# Patient Record
Sex: Male | Born: 1995 | Race: White | Hispanic: Yes | Marital: Single | Smoking: Never smoker
Health system: Southern US, Community
[De-identification: ages and names within clinical notes are randomized; demographics above are authoritative.]

## PROBLEM LIST (undated history)

## (undated) DIAGNOSIS — Z9889 Other specified postprocedural states: Secondary | ICD-10-CM

## (undated) HISTORY — PX: OTHER SURGICAL HISTORY: SHX169

## (undated) HISTORY — PX: NASAL SEPTUM SURGERY: SHX37

## (undated) HISTORY — DX: Other specified postprocedural states: Z98.890

---
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
## 2022-10-27 ENCOUNTER — Ambulatory Visit: Payer: Self-pay | Admitting: Medical

## 2022-12-12 ENCOUNTER — Ambulatory Visit: Payer: Self-pay | Admitting: Medical

## 2022-12-13 ENCOUNTER — Ambulatory Visit (INDEPENDENT_AMBULATORY_CARE_PROVIDER_SITE_OTHER): Payer: PRIVATE HEALTH INSURANCE | Admitting: Medical

## 2022-12-13 ENCOUNTER — Encounter: Payer: Self-pay | Admitting: Medical

## 2022-12-13 VITALS — BP 137/72 | HR 78 | Temp 98.2°F | Resp 18 | Ht 78.0 in | Wt 245.0 lb

## 2022-12-13 DIAGNOSIS — Z0000 Encounter for general adult medical examination without abnormal findings: Secondary | ICD-10-CM

## 2022-12-13 LAB — COMPREHENSIVE METABOLIC PANEL
ALT: 50 U/L (ref 0–53)
AST: 27 U/L (ref 0–37)
Albumin: 4.6 g/dL (ref 3.5–5.2)
Alkaline Phosphatase: 86 U/L (ref 39–117)
BUN: 22 mg/dL (ref 6–23)
CO2: 29 mEq/L (ref 19–32)
Calcium: 9.4 mg/dL (ref 8.4–10.5)
Chloride: 102 mEq/L (ref 96–112)
Creatinine, Ser: 1.07 mg/dL (ref 0.40–1.50)
GFR: 96.18 mL/min (ref >60.00)
Glucose, Bld: 91 mg/dL (ref 70–99)
Potassium: 4.2 mEq/L (ref 3.5–5.1)
Sodium: 137 mEq/L (ref 135–145)
Total Bilirubin: 0.7 mg/dL (ref 0.2–1.2)
Total Protein: 7.3 g/dL (ref 6.0–8.3)

## 2022-12-13 LAB — CBC WITH DIFFERENTIAL/PLATELET
Basophils Absolute: 0.1 10*3/uL (ref 0.0–0.1)
Basophils Relative: 1 % (ref 0.0–3.0)
Eosinophils Absolute: 0.1 10*3/uL (ref 0.0–0.7)
Eosinophils Relative: 1.6 % (ref 0.0–5.0)
HCT: 45.5 % (ref 39.0–52.0)
Hemoglobin: 15.6 g/dL (ref 13.0–17.0)
Lymphocytes Relative: 29 % (ref 12.0–46.0)
Lymphs Abs: 1.6 10*3/uL (ref 0.7–4.0)
MCHC: 34.3 g/dL (ref 30.0–36.0)
MCV: 90.4 fl (ref 78.0–100.0)
Monocytes Absolute: 0.5 10*3/uL (ref 0.1–1.0)
Monocytes Relative: 9.2 % (ref 3.0–12.0)
Neutro Abs: 3.3 10*3/uL (ref 1.4–7.7)
Neutrophils Relative %: 59.2 % (ref 43.0–77.0)
Platelets: 218 10*3/uL (ref 150.0–400.0)
RBC: 5.03 Mil/uL (ref 4.22–5.81)
RDW: 12.2 % (ref 11.5–15.5)
WBC: 5.5 10*3/uL (ref 4.0–10.5)

## 2022-12-13 LAB — LIPID PANEL
Cholesterol: 244 mg/dL — ABNORMAL HIGH (ref 0–200)
HDL: 49.7 mg/dL (ref >39.00)
LDL Cholesterol: 161 mg/dL — ABNORMAL HIGH (ref 0–99)
NonHDL: 193.88
Total CHOL/HDL Ratio: 5
Triglycerides: 165 mg/dL — ABNORMAL HIGH (ref 0.0–149.0)
VLDL: 33 mg/dL (ref 0.0–40.0)

## 2022-12-13 NOTE — Progress Notes (Signed)
Subjective:    Patient ID: Shawn Hanson, male    DOB: [birth year], 26 y.o.   MRN: [id number]  HPI  Pt in for first time. He wants to get established and to do blood work.  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 He is moving there. He has not been exercising. Nonsmoker. Social alcohol use on weekends. Drinks cup of coffee 3 days a week.       Review of Systems  Constitutional:  Negative for chills, fatigue and fever.  HENT:  Negative for congestion and drooling.   Respiratory:  Negative for cough, chest tightness, shortness of breath and wheezing.   Cardiovascular:  Negative for chest pain and palpitations.  Gastrointestinal:  Negative for abdominal pain, constipation and nausea.  Genitourinary:  Negative for dysuria and flank pain.  Musculoskeletal:  Negative for back pain, joint swelling and myalgias.  Skin:  Negative for rash.  Neurological:  Negative for dizziness and numbness.  Hematological:  Negative for adenopathy. Does not bruise/bleed easily.  Psychiatric/Behavioral:  Negative for behavioral problems, decreased concentration and dysphoric mood.     Past Medical History:  Diagnosis Date   H/O right wrist surgery    ganglion cyst     Social History   Socioeconomic History   Marital status: Single    Spouse name: Not on file   Number of children: Not on file   Years of education: Not on file   Highest education level: Not on file  Occupational History   Not on file  Tobacco Use   Smoking status: Never   Smokeless tobacco: Never  Vaping Use   Vaping Use: Never used  Substance and Sexual Activity   Alcohol use: Yes    Alcohol/week: 2.0 standard drinks of alcohol    Types: 2 Cans of beer per week    Comment: 2-4 beers on wekends.   Drug use: Never   Sexual activity: Not on file  Other Topics Concern   Not on file  Social History Narrative   Not on file   Social Determinants of Health   Financial Resource Strain: Not on file  Food  Insecurity: Not on file  Transportation Needs: Not on file  Physical Activity: Not on file  Stress: Not on file  Social Connections: Not on file  Intimate Partner Violence: Not on file    Past Surgical History:  Procedure Laterality Date   ganglion cyst surgery Right    NASAL SEPTUM SURGERY      History reviewed. No pertinent family history.  Not on File  No current outpatient medications on file prior to visit.   No current facility-administered medications on file prior to visit.    BP 137/72   Pulse 78   Temp 98.2 F (36.8 C)   Resp 18   Ht 6\' 6"  (1.981 m)   Wt 245 lb (111.1 kg)   SpO2 97%   BMI 28.31 kg/m        Objective:   Physical Exam  General Mental Status- Alert. General Appearance- Not in acute distress.   Skin General: Color- Normal Color. Moisture- Normal Moisture.  Neck Carotid Arteries- Normal color. Moisture- Normal Moisture. No carotid bruits. No JVD.  Chest and Lung Exam Auscultation: Breath Sounds:-Normal.  Cardiovascular Auscultation:Rythm- Regular. Murmurs & Other Heart Sounds:Auscultation of the heart reveals- No Murmurs.  Abdomen Inspection:-Inspeection Normal. Palpation/Percussion:Note:No mass. Palpation and Percussion of the abdomen reveal- Non Tender, Non Distended + BS, no rebound or guarding.  Neurologic Cranial Nerve exam:- CN III-XII intact(No nystagmus), symmetric smile. Strength:- 5/5 equal and symmetric strength both upper and lower extremities.       Assessment & Plan:   Patient Instructions  For you wellness exam today I have ordered cbc, cmp and  lipid panel.   Flu vaccine this year. Screening TB  negative/done for his school in October. Pt thinks up to date on tdap. He thinks got one at age 66 yo.  Recommend exercise and healthy diet.  We will let you know lab results as they come in.  Follow up date appointment will be determined after lab review.           Esperanza Richters, PA-C

## 2022-12-13 NOTE — Patient Instructions (Addendum)
For you wellness exam today I have ordered cbc, cmp and  lipid panel.   Flu vaccine this year. Screening TB  negative/done for his school in October. Pt thinks up to date on tdap. He thinks got one at age 26 yo.  Recommend exercise and healthy diet.  We will let you know lab results as they come in.  Follow up date appointment will be determined after lab review.     Preventive Care 35-32 Years Old, Male Preventive care refers to lifestyle choices and visits with your health care provider that can promote health and wellness. Preventive care visits are also called wellness exams. What can I expect for my preventive care visit? Counseling During your preventive care visit, your health care provider may ask about your: Medical history, including: Past medical problems. Family medical history. Current health, including: Emotional well-being. Home life and relationship well-being. Sexual activity. Lifestyle, including: Alcohol, nicotine or tobacco, and drug use. Access to firearms. Diet, exercise, and sleep habits. Safety issues such as seatbelt and bike helmet use. Sunscreen use. Work and work Astronomer. Physical exam Your health care provider may check your: Height and weight. These may be used to calculate your BMI (body mass index). BMI is a measurement that tells if you are at a healthy weight. Waist circumference. This measures the distance around your waistline. This measurement also tells if you are at a healthy weight and may help predict your risk of certain diseases, such as type 2 diabetes and high blood pressure. Heart rate and blood pressure. Body temperature. Skin for abnormal spots. What immunizations do I need?  Vaccines are usually given at various ages, according to a schedule. Your health care provider will recommend vaccines for you based on your age, medical history, and lifestyle or other factors, such as travel or where you work. What tests do I  need? Screening Your health care provider may recommend screening tests for certain conditions. This may include: Lipid and cholesterol levels. Diabetes screening. This is done by checking your blood sugar (glucose) after you have not eaten for a while (fasting). Hepatitis B test. Hepatitis C test. HIV (human immunodeficiency virus) test. STI (sexually transmitted infection) testing, if you are at risk. Talk with your health care provider about your test results, treatment options, and if necessary, the need for more tests. Follow these instructions at home: Eating and drinking  Eat a healthy diet that includes fresh fruits and vegetables, whole grains, lean protein, and low-fat dairy products. Drink enough fluid to keep your urine pale yellow. Take vitamin and mineral supplements as recommended by your health care provider. Do not drink alcohol if your health care provider tells you not to drink. If you drink alcohol: Limit how much you have to 0-2 drinks a day. Know how much alcohol is in your drink. In the [place] one drink equals one 12 oz bottle of beer (355 mL), one 5 oz glass of wine (148 mL), or one 1 oz glass of hard liquor (44 mL). Lifestyle Brush your teeth every morning and night with fluoride toothpaste. Floss one time each day. Exercise for at least 30 minutes 5 or more days each week. Do not use any products that contain nicotine or tobacco. These products include cigarettes, chewing tobacco, and vaping devices, such as e-cigarettes. If you need help quitting, ask your health care provider. Do not use drugs. If you are sexually active, practice safe sex. Use a condom or other form of protection to prevent STIs.  Find healthy ways to manage stress, such as: Meditation, yoga, or listening to music. Journaling. Talking to a trusted person. Spending time with friends and family. Minimize exposure to UV radiation to reduce your risk of skin cancer. Safety Always wear your  seat belt while driving or riding in a vehicle. Do not drive: If you have been drinking alcohol. Do not ride with someone who has been drinking. If you have been using any mind-altering substances or drugs. While texting. When you are tired or distracted. Wear a helmet and other protective equipment during sports activities. If you have firearms in your house, make sure you follow all gun safety procedures. Seek help if you have been physically or sexually abused. What's next? Go to your health care provider once a year for an annual wellness visit. Ask your health care provider how often you should have your eyes and teeth checked. Stay up to date on all vaccines. This information is not intended to replace advice given to you by your health care provider. Make sure you discuss any questions you have with your health care provider. Document Revised: 06/01/2021 Document Reviewed: 06/01/2021 Elsevier Patient Education  [place].
